# Patient Record
Sex: Female | Born: 1960 | Race: White | Hispanic: No | Marital: Married | Smoking: Never smoker
Health system: Southern US, Community
[De-identification: ages and names within clinical notes are randomized; demographics above are authoritative.]

## PROBLEM LIST (undated history)

## (undated) DIAGNOSIS — C55 Malignant neoplasm of uterus, part unspecified: Secondary | ICD-10-CM

## (undated) DIAGNOSIS — F419 Anxiety disorder, unspecified: Secondary | ICD-10-CM

## (undated) DIAGNOSIS — I10 Essential (primary) hypertension: Secondary | ICD-10-CM

## (undated) DIAGNOSIS — C801 Malignant (primary) neoplasm, unspecified: Secondary | ICD-10-CM

## (undated) HISTORY — PX: ABDOMINAL HYSTERECTOMY: SHX81

## (undated) HISTORY — PX: TONSILLECTOMY: SUR1361

---

## 2015-07-10 ENCOUNTER — Emergency Department (HOSPITAL_BASED_OUTPATIENT_CLINIC_OR_DEPARTMENT_OTHER)
Admission: EM | Admit: 2015-07-10 | Discharge: 2015-07-10 | Disposition: A | Discharge status: Home / Self Care | Payer: BLUE CROSS/BLUE SHIELD | Attending: Emergency Medicine | Admitting: Emergency Medicine

## 2015-07-10 ENCOUNTER — Encounter (HOSPITAL_BASED_OUTPATIENT_CLINIC_OR_DEPARTMENT_OTHER): Payer: Self-pay | Admitting: Emergency Medicine

## 2015-07-10 ENCOUNTER — Emergency Department (HOSPITAL_BASED_OUTPATIENT_CLINIC_OR_DEPARTMENT_OTHER): Payer: BLUE CROSS/BLUE SHIELD

## 2015-07-10 DIAGNOSIS — R0609 Other forms of dyspnea: Secondary | ICD-10-CM

## 2015-07-10 DIAGNOSIS — R0602 Shortness of breath: Secondary | ICD-10-CM | POA: Diagnosis present

## 2015-07-10 DIAGNOSIS — R05 Cough: Secondary | ICD-10-CM | POA: Insufficient documentation

## 2015-07-10 DIAGNOSIS — Z8541 Personal history of malignant neoplasm of cervix uteri: Secondary | ICD-10-CM | POA: Insufficient documentation

## 2015-07-10 DIAGNOSIS — Z79899 Other long term (current) drug therapy: Secondary | ICD-10-CM | POA: Insufficient documentation

## 2015-07-10 DIAGNOSIS — I10 Essential (primary) hypertension: Secondary | ICD-10-CM | POA: Diagnosis not present

## 2015-07-10 DIAGNOSIS — F419 Anxiety disorder, unspecified: Secondary | ICD-10-CM | POA: Diagnosis not present

## 2015-07-10 DIAGNOSIS — J45901 Unspecified asthma with (acute) exacerbation: Secondary | ICD-10-CM | POA: Insufficient documentation

## 2015-07-10 HISTORY — DX: Anxiety disorder, unspecified: F41.9

## 2015-07-10 HISTORY — DX: Malignant neoplasm of uterus, part unspecified: C55

## 2015-07-10 HISTORY — DX: Essential (primary) hypertension: I10

## 2015-07-10 HISTORY — DX: Malignant (primary) neoplasm, unspecified: C80.1

## 2015-07-10 LAB — CBC WITH DIFFERENTIAL/PLATELET
Basophils Absolute: 0 10*3/uL (ref 0.0–0.1)
Basophils Relative: 0 %
EOS ABS: 0.3 10*3/uL (ref 0.0–0.7)
EOS PCT: 4 %
HCT: 38 % (ref 36.0–46.0)
HEMOGLOBIN: 13.5 g/dL (ref 12.0–15.0)
Lymphocytes Relative: 26 %
Lymphs Abs: 2.1 10*3/uL (ref 0.7–4.0)
MCH: 30.5 pg (ref 26.0–34.0)
MCHC: 35.5 g/dL (ref 30.0–36.0)
MCV: 86 fL (ref 78.0–100.0)
MONO ABS: 0.4 10*3/uL (ref 0.1–1.0)
MONOS PCT: 6 %
Neutro Abs: 5.1 10*3/uL (ref 1.7–7.7)
Neutrophils Relative %: 64 %
Platelets: 228 10*3/uL (ref 150–400)
RBC: 4.42 MIL/uL (ref 3.87–5.11)
RDW: 13.4 % (ref 11.5–15.5)
WBC: 7.8 10*3/uL (ref 4.0–10.5)

## 2015-07-10 LAB — BRAIN NATRIURETIC PEPTIDE: B Natriuretic Peptide: 11.2 pg/mL (ref 0.0–100.0)

## 2015-07-10 LAB — BASIC METABOLIC PANEL
Anion gap: 10 (ref 5–15)
BUN: 9 mg/dL (ref 6–20)
CALCIUM: 8.9 mg/dL (ref 8.9–10.3)
CO2: 20 mmol/L — AB (ref 22–32)
CREATININE: 0.66 mg/dL (ref 0.44–1.00)
Chloride: 101 mmol/L (ref 101–111)
GFR calc Af Amer: >60 mL/min (ref >60)
GFR calc non Af Amer: >60 mL/min (ref >60)
GLUCOSE: 114 mg/dL — AB (ref 65–99)
Potassium: 4.1 mmol/L (ref 3.5–5.1)
Sodium: 131 mmol/L — ABNORMAL LOW (ref 135–145)

## 2015-07-10 LAB — TROPONIN I

## 2015-07-10 MED ORDER — IOPAMIDOL (ISOVUE-370) INJECTION 76%
80.0000 mL | Freq: Once | INTRAVENOUS | Status: AC | PRN
  Administered 2015-07-10: 80 mL via INTRAVENOUS

## 2015-07-10 MED ORDER — SODIUM CHLORIDE 0.9 % IV BOLUS (SEPSIS)
1000.0000 mL | Freq: Once | INTRAVENOUS | Status: AC
  Administered 2015-07-10: 1000 mL via INTRAVENOUS

## 2015-07-10 MED ORDER — ALBUTEROL SULFATE HFA 108 (90 BASE) MCG/ACT IN AERS
2.0000 | INHALATION_SPRAY | Freq: Once | RESPIRATORY_TRACT | Status: AC
  Administered 2015-07-10: 2 via RESPIRATORY_TRACT
  Filled 2015-07-10: qty 6.7

## 2015-07-10 NOTE — Discharge Instructions (Signed)
Use the albuterol inhaler 1-2 puffs every 4 hours as needed for shortness of breath. If you develop worse shortness of breath, chest pressure/pain or other new/concerning symptoms then return to the ER

## 2015-07-10 NOTE — ED Notes (Signed)
Patient states that she has had SOb x 3 days. Went to urgent care and was sent here to r/o a PE - the patient reports that she was told that the X-ray was abnormal for decreased lung volume and the Dr was concerned. The patient in not distress unless talking. Can talk in complete sentances but this Rn can tell that she is SOB when talking. Patient states that she feels better when she is laying down.

## 2015-07-10 NOTE — ED Notes (Signed)
Pt and husband given d/c instructions as per chart. Verbalizes understanding. No questions. 

## 2015-07-10 NOTE — ED Provider Notes (Signed)
CSN: [id number]     Arrival date & time 07/10/15  1251 History   First MD Initiated Contact with Patient 07/10/15 1329     Chief Complaint  Patient presents with  . Shortness of Breath     (Consider location/radiation/quality/duration/timing/severity/associated sxs/prior Treatment) HPI  55 year old female presents with a three-day history of shortness of breath. Patient feels like she can't get enough air. When she is lying perfectly still and flat she feels normal. However talking or minimal exertion causes her to get short of breath. There is no chest pain. She coughs on deep inspiration but otherwise no cough. Her temperature is 100 here but she has not noticed a fever or had symptoms of fever or chills at home. Prior history of uterine cancer. She went to urgent care with a took an x-ray that showed "decreased lung volume and "the doctor was concerned about PE and so sent her here for a CT scan. No leg swelling, leg pain, recent travel, recent surgery, estrogen use, or hemoptysis. She notes a past history of asthma but does not have flares often.  Past Medical History  Diagnosis Date  . Cancer ([place])   . Uterine cancer ([place])   . Hypertension   . Anxiety    Past Surgical History  Procedure Laterality Date  . Abdominal hysterectomy    . Tonsillectomy     History reviewed. No pertinent family history. Social History  Substance Use Topics  . Smoking status: Never Smoker   . Smokeless tobacco: None  . Alcohol Use: Yes     Comment: rarely   OB History    No data available     Review of Systems  Constitutional: Negative for fever.  Respiratory: Positive for cough and shortness of breath.   Cardiovascular: Negative for chest pain and leg swelling.  All other systems reviewed and are negative.     Allergies  Review of patient's allergies indicates no known allergies.  Home Medications   Prior to Admission medications   Medication Sig Start Date End Date Taking?  Authorizing Provider  citalopram (CELEXA) 20 MG tablet Take 20 mg by mouth daily.   Yes Historical Provider, MD  losartan (COZAAR) 25 MG tablet Take 25 mg by mouth daily.   Yes Historical Provider, MD   BP 90/66 mmHg  Pulse 96  Temp(Src) 100 F (37.8 C) (Oral)  Resp 20  Ht 5' 4.5" (1.638 m)  Wt 165 lb 5.5 oz (75 kg)  BMI 27.95 kg/m2  SpO2 100% Physical Exam  Constitutional: She is oriented to person, place, and time. She appears well-developed and well-nourished. No distress.  HENT:  Head: Normocephalic and atraumatic.  Right Ear: External ear normal.  Left Ear: External ear normal.  Nose: Nose normal.  Eyes: Right eye exhibits no discharge. Left eye exhibits no discharge.  Cardiovascular: Normal rate, regular rhythm and normal heart sounds.   Pulmonary/Chest: Effort normal and breath sounds normal. She has no wheezes.  Abdominal: Soft. She exhibits no distension. There is no tenderness.  Musculoskeletal: She exhibits no edema.  Neurological: She is alert and oriented to person, place, and time.  Skin: Skin is warm and dry. She is not diaphoretic.  Nursing note and vitals reviewed.   ED Course  Procedures (including critical care time) Labs Review Labs Reviewed  BASIC METABOLIC PANEL - Abnormal; Notable for the following:    Sodium 131 (*)    CO2 20 (*)    Glucose, Bld 114 (*)    All  other components within normal limits  CBC WITH DIFFERENTIAL/PLATELET  TROPONIN I  BRAIN NATRIURETIC PEPTIDE    Imaging Review Ct Angio Chest Pe W/cm &/or Wo Cm  07/10/2015  CLINICAL DATA:  Shortness of breath for 3 days. Uterine carcinoma. Clinical suspicion for pulmonary embolism. EXAM: CT ANGIOGRAPHY CHEST WITH CONTRAST TECHNIQUE: Multidetector CT imaging of the chest was performed using the standard protocol during bolus administration of intravenous contrast. Multiplanar CT image reconstructions and MIPs were obtained to evaluate the vascular anatomy. CONTRAST:  80 mL Isovue 370  COMPARISON:  None. FINDINGS: Mediastinum/Lymph Nodes: No pulmonary emboli or thoracic aortic dissection identified. Heart size is within normal limits. No evidence of pericardial effusion. No masses or pathologically enlarged lymph nodes identified. Lungs/Pleura: No pulmonary mass, infiltrate, or effusion. Upper abdomen: No acute findings. Musculoskeletal: No chest wall mass or suspicious bone lesions identified. Review of the MIP images confirms the above findings. IMPRESSION: Negative. No evidence of pulmonary embolism or other active disease within the thorax. Electronically Signed   By: Earle Gell M.D.   On: 07/10/2015 15:00   I have personally reviewed and evaluated these images and lab results as part of my medical decision-making.   EKG Interpretation   Date/Time:  Saturday July 10 2015 13:25:11 EDT Ventricular Rate:  76 PR Interval:  167 QRS Duration: 110 QT Interval:  392 QTC Calculation: 441 R Axis:   -90 Text Interpretation:  Sinus rhythm Left anterior fascicular block Abnormal  R-wave progression, late transition Borderline T abnormalities, anterior  leads No old tracing to compare Confirmed by Ion Gonnella  MD, [place] ([id number]) on  07/10/2015 1:28:55 PM       EKG Interpretation  Date/Time:  Saturday July 10 2015 15:09:27 EDT Ventricular Rate:  75 PR Interval:  170 QRS Duration: 112 QT Interval:  414 QTC Calculation: 462 R Axis:   -86 Text Interpretation:  Sinus rhythm Left anterior fascicular block Abnormal R-wave progression, late transition Nonspecific T abnormalities, anterior leads no significant change since earlier in the day Confirmed by [place] ([id number]) on 07/10/2015 3:16:38 PM       MDM   Final diagnoses:  Dyspnea on exertion    Patient's workup shows no obvious cause for dyspnea. CT scan is obtained given a [place] of 4.5 (PE equal/#1 and tachycardia at urgent care). No obvious PE or pneumonia. During her workup she was given albuterol by inhaler. After  workup was completed she was ambulated around the ED and had no hypoxia and did not feel any shortness of breath. She feels like she is back to normal after the inhaler. Could've had some bronchospasm or mild bronchitis causing her symptoms. While her ECG does show some nonspecific T waves I think ACS is less likely. There is no old for me to compare to but there are no evolving changes. With 3 days of nearly continuous symptoms and a negative troponin I do not think further workup is indicated emergently. Her friend is a cardiology PA in this area and she feels like she can get seen in the clinic in the next couple days where they can reevaluate her and possibly do a stress test. Discussed strict return precautions and recommended using albuterol inhaler at home as needed.    Sherwood Gambler, MD 07/10/15 [phone]

## 2017-03-23 IMAGING — CT CT ANGIO CHEST
2 of 6 series · 19 of 36 positions shown · IV contrast (isovue)
Comparison: None.

CLINICAL DATA: Shortness of breath for 3 days. Uterine carcinoma.
Clinical suspicion for pulmonary embolism.

EXAM:
CT ANGIOGRAPHY CHEST WITH CONTRAST
TECHNIQUE: Multidetector CT imaging of the chest was performed using the
standard protocol during bolus administration of intravenous
contrast. Multiplanar CT image reconstructions and MIPs were
obtained to evaluate the vascular anatomy.
CONTRAST:  80 mL Isovue 370

[Series 8: pe thins · axial · 0.59mm/px · z∈[-295,-40]mm · 18 of 285 slices shown]
[im 15/285  lung]
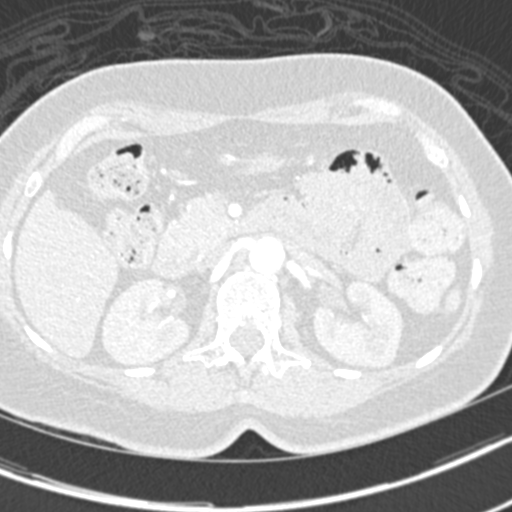
[im 29/285  mediastinal]
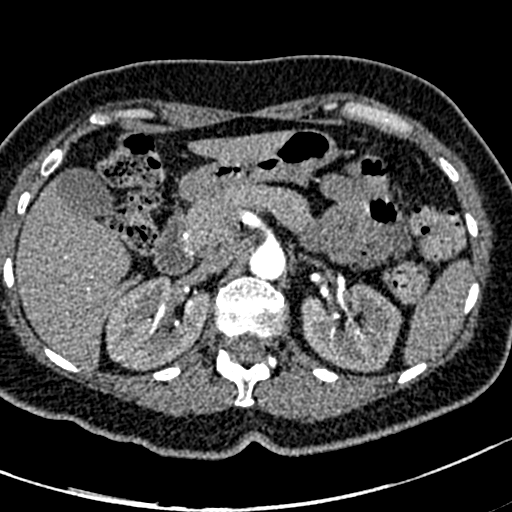
[im 43/285  lung]
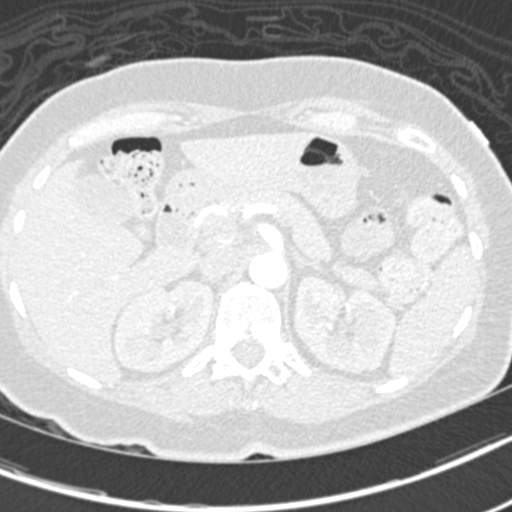
[im 57/285  mediastinal]
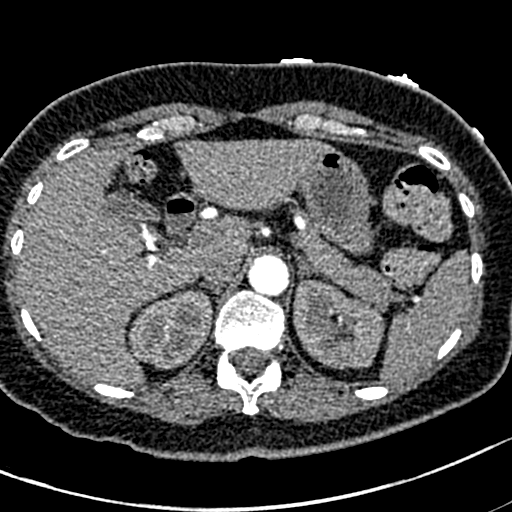
[im 72/285  lung]
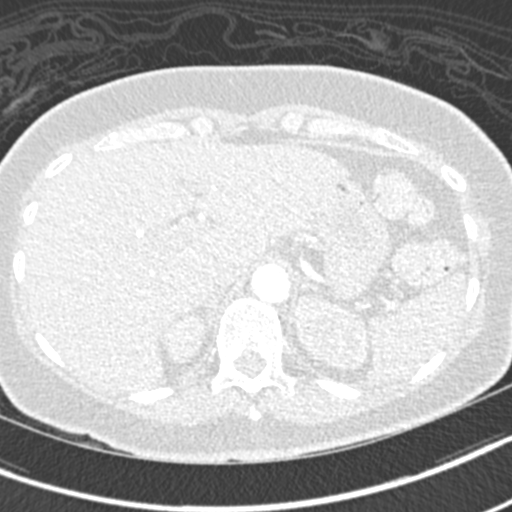
[im 86/285  mediastinal]
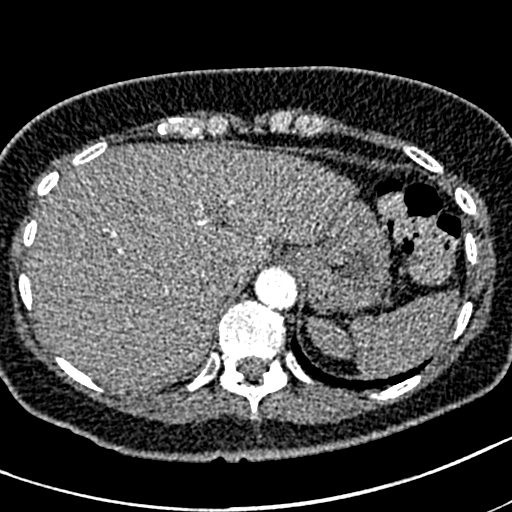
[im 100/285  lung]
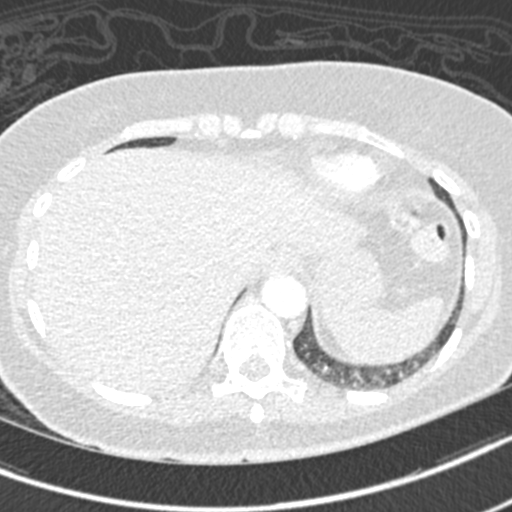
[im 114/285  mediastinal]
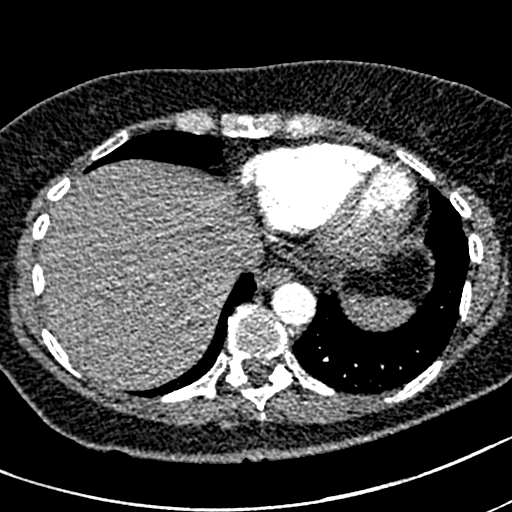
[im 128/285  lung]
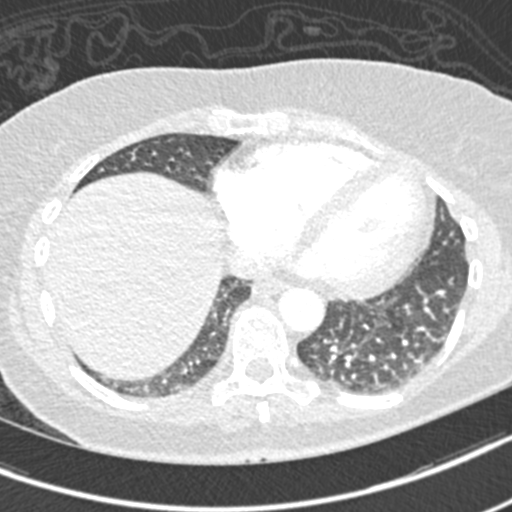
[im 157/285  mediastinal]
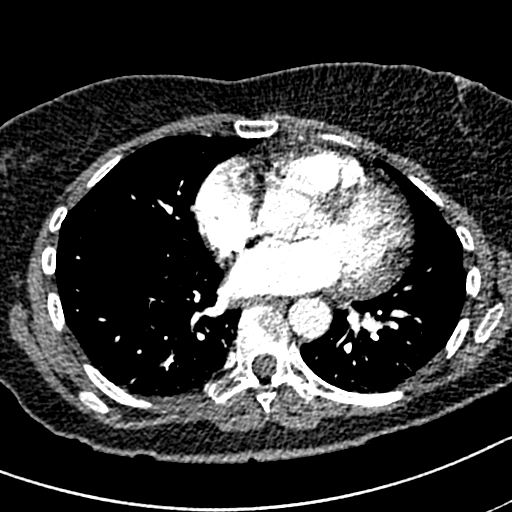
[im 171/285  lung]
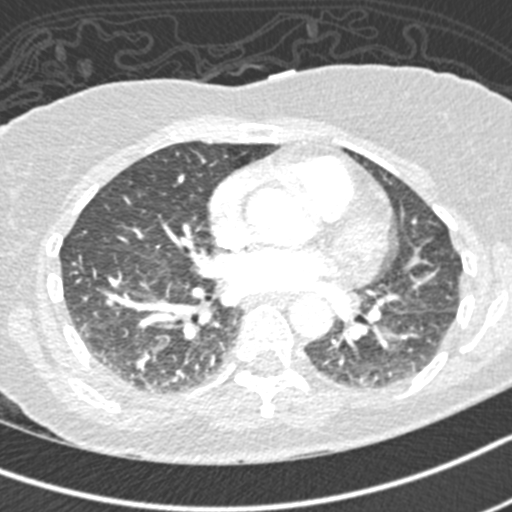
[im 185/285  mediastinal]
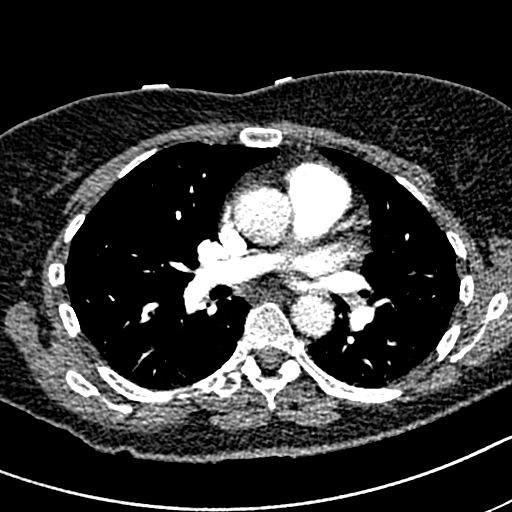
[im 199/285  lung]
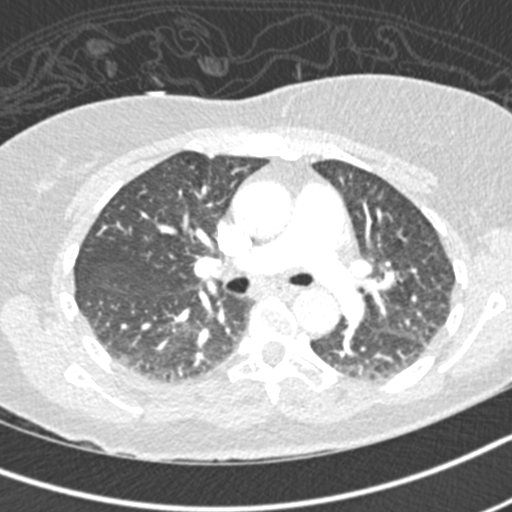
[im 214/285  mediastinal]
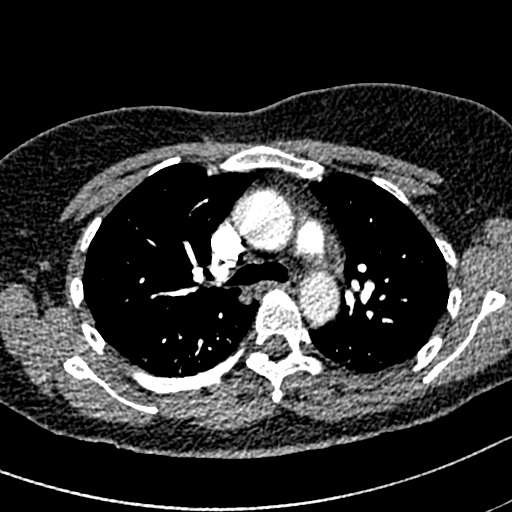
[im 228/285  lung]
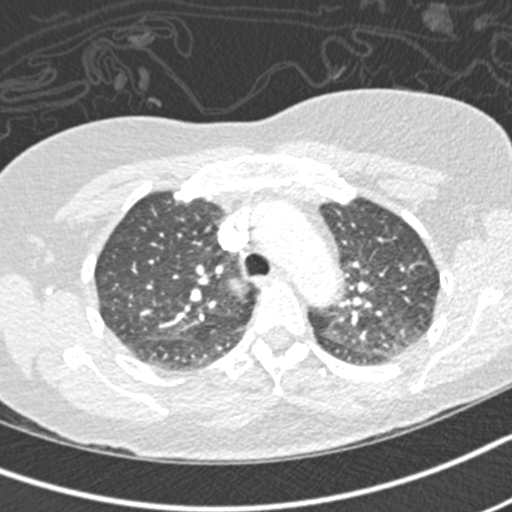
[im 242/285  mediastinal]
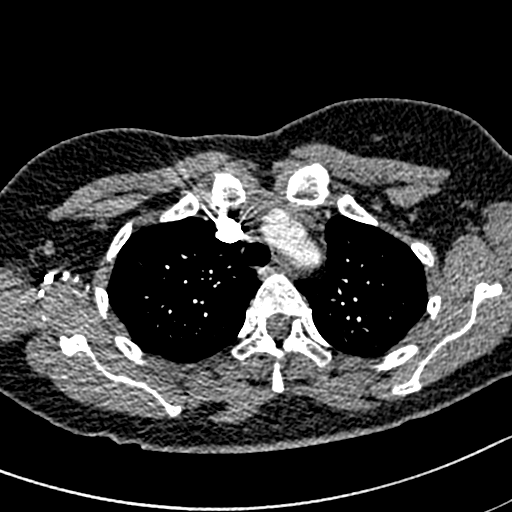
[im 256/285  lung]
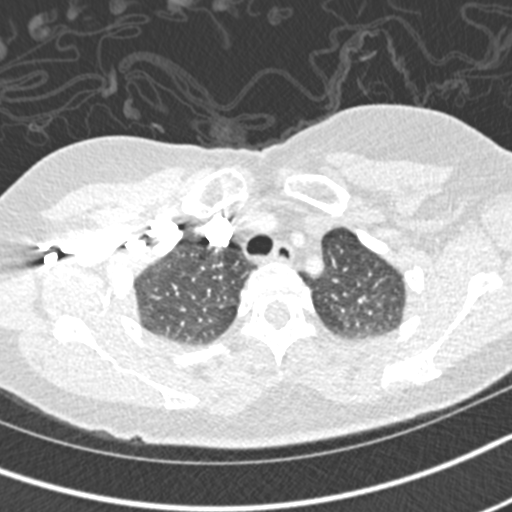
[im 270/285  mediastinal]
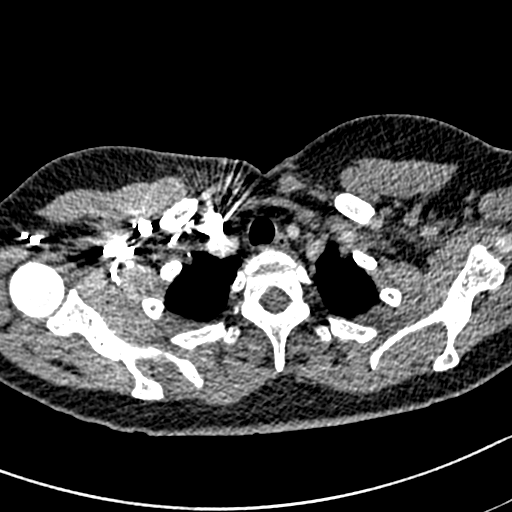

[Series 9: pe coronal mpr · coronal · 0.59mm/px · 1 of 88 slices shown]
[im 44/88  mediastinal]
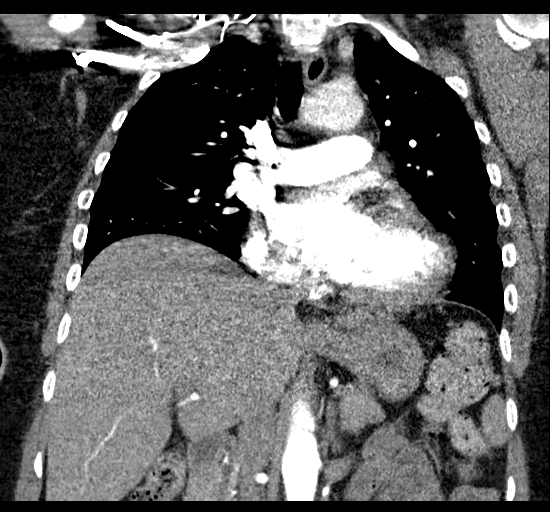

[19 of 36 positions shown; findings below may reference images not displayed]

FINDINGS: Mediastinum/Lymph Nodes: No pulmonary emboli or thoracic aortic
dissection identified. Heart size is within normal limits. No
evidence of pericardial effusion. No masses or pathologically
enlarged lymph nodes identified.

Lungs/Pleura: No pulmonary mass, infiltrate, or effusion.

Upper abdomen: No acute findings.

Musculoskeletal: No chest wall mass or suspicious bone lesions
identified.

Review of the MIP images confirms the above findings.
IMPRESSION: Negative. No evidence of pulmonary embolism or other active disease
within the thorax.

## 2022-11-10 ENCOUNTER — Telehealth: Payer: Self-pay | Admitting: Gastroenterology

## 2022-11-10 NOTE — Telephone Encounter (Signed)
Patient will have records faxed from previous provider for Dr Russella Dar to review.

## 2022-11-21 NOTE — Telephone Encounter (Signed)
Faxed requested for patient records to [facility].

## 2022-11-28 ENCOUNTER — Telehealth: Payer: Self-pay | Admitting: Gastroenterology

## 2022-11-28 NOTE — Telephone Encounter (Signed)
Called patient to advise we have records and also to ask the reason for request to Dr. Russella Dar. Left voicemail.

## 2022-11-28 NOTE — Telephone Encounter (Signed)
Given my retirement in December 2024 I am no longer accepting new patients. Perhaps one of our other physicians can accommodate.

## 2022-11-28 NOTE — Telephone Encounter (Signed)
Hi Dr. Russella Dar,  Patient called requesting a transfer of care specifically over to you if possible from [facility]. Stated her previous provider is no longer available and her husband is currently your patient. Seeking to have her colonoscopy done as soon as possible. Records were obtained and scanned into [employer] for you to review and advise on scheduling.   Thanks

## 2022-12-06 NOTE — Telephone Encounter (Signed)
Looks like her next colonoscopy is due in 01/2024 for colon cancer screening. Okay to place a recall for a colonoscopy in the [facility] in 01/2024. If she has any GI concerns, okay to schedule for OV.  Colonoscopy 01/29/14: Normal. Prep was adequate to identify polyps 5 mm or larger. Repeat colonoscopy recommended in 10 years.

## 2022-12-06 NOTE — Telephone Encounter (Signed)
Hi Dr. Leonides Schanz,  Supervising Provider 12/06/22-PM  Would be able to accept this transfer of care request?  Thanks

## 2022-12-07 NOTE — Telephone Encounter (Signed)
Patient has been advised and recall was placed in Epic.

## 2023-06-05 ENCOUNTER — Ambulatory Visit
Admission: EM | Admit: 2023-06-05 | Discharge: 2023-06-05 | Disposition: A | Discharge status: Home / Self Care | Attending: Family Medicine | Admitting: Family Medicine

## 2023-06-05 DIAGNOSIS — J309 Allergic rhinitis, unspecified: Secondary | ICD-10-CM

## 2023-06-05 DIAGNOSIS — J0100 Acute maxillary sinusitis, unspecified: Secondary | ICD-10-CM

## 2023-06-05 DIAGNOSIS — R059 Cough, unspecified: Secondary | ICD-10-CM | POA: Diagnosis not present

## 2023-06-05 DIAGNOSIS — J3489 Other specified disorders of nose and nasal sinuses: Secondary | ICD-10-CM | POA: Diagnosis not present

## 2023-06-05 MED ORDER — HYDROCODONE BIT-HOMATROP MBR 5-1.5 MG/5ML PO SOLN: 5.0000 mL | Freq: Four times a day (QID) | ORAL | 0 refills | Status: AC | PRN

## 2023-06-05 MED ORDER — FEXOFENADINE HCL 180 MG PO TABS: 180.0000 mg | ORAL_TABLET | Freq: Every day | ORAL | 0 refills | Status: AC

## 2023-06-05 MED ORDER — PREDNISONE 20 MG PO TABS: ORAL_TABLET | ORAL | 0 refills | Status: AC

## 2023-06-05 MED ORDER — AMOXICILLIN-POT CLAVULANATE 875-125 MG PO TABS: 1.0000 | ORAL_TABLET | Freq: Two times a day (BID) | ORAL | 0 refills | Status: AC

## 2023-06-05 NOTE — ED Triage Notes (Addendum)
 Pt presents to uc with co sore throat and congestion, sore throat, and chills. Pt reports recent travel to [place] and has had symptoms since. Pt reports about 2 weeks of symptoms. Covis neg at home

## 2023-06-05 NOTE — ED Provider Notes (Signed)
 [facility]    CSN: [id number] Arrival date & time: 06/05/23  1654      History   Chief Complaint Chief Complaint  Patient presents with   Sore Throat   facial congestion     HPI Betty Lee is a 63 y.o. female.   HPI 63 year old female presents with sore throat and facial congestion for 2 weeks.  PMH significant for uterine cancer and HTN.  Past Medical History:  Diagnosis Date   Anxiety    Cancer ([facility])    Hypertension    Uterine cancer ([facility])     There are no active problems to display for this patient.   Past Surgical History:  Procedure Laterality Date   ABDOMINAL HYSTERECTOMY     TONSILLECTOMY      OB History   No obstetric history on file.      Home Medications    Prior to Admission medications   Medication Sig Start Date End Date Taking? Authorizing Provider  amoxicillin-clavulanate (AUGMENTIN) 875-125 MG tablet Take 1 tablet by mouth every 12 (twelve) hours. 06/05/23  Yes Trevor Iha, FNP  fexofenadine [facility] ALLERGY) 180 MG tablet Take 1 tablet (180 mg total) by mouth daily for 15 days. 06/05/23 06/20/23 Yes Trevor Iha, FNP  HYDROcodone bit-homatropine (HYCODAN) 5-1.5 MG/5ML syrup Take 5 mLs by mouth every 6 (six) hours as needed for cough. 06/05/23  Yes Trevor Iha, FNP  predniSONE (DELTASONE) 20 MG tablet Take 3 tabs PO daily x 5 days. 06/05/23  Yes Trevor Iha, FNP  citalopram (CELEXA) 20 MG tablet Take 20 mg by mouth daily.    [provider]  losartan (COZAAR) 25 MG tablet Take 25 mg by mouth daily.    [provider]    Family History Family History  Problem Relation Age of Onset   Cancer Mother    Hypertension Father    Heart failure Father     Social History Social History   Tobacco Use   Smoking status: Never  Substance Use Topics   Alcohol use: Yes    Comment: rarely   Drug use: No     Allergies   Patient has no known allergies.   Review of Systems Review of  Systems   Physical Exam Triage Vital Signs ED Triage Vitals  Encounter Vitals Group     BP 06/05/23 1730 (!) 149/91     Systolic BP Percentile --      Diastolic BP Percentile --      Pulse Rate 06/05/23 1730 88     Resp 06/05/23 1730 16     Temp 06/05/23 1730 98.7 F (37.1 C)     Temp src --      SpO2 06/05/23 1730 98 %     Weight --      Height --      Head Circumference --      Peak Flow --      Pain Score 06/05/23 1729 3     Pain Loc --      Pain Education --      Exclude from Growth Chart --    No data found.  Updated Vital Signs BP (!) 149/91   Pulse 88   Temp 98.7 F (37.1 C)   Resp 16   SpO2 98%    Physical Exam Vitals and nursing note reviewed.  Constitutional:      Appearance: Normal appearance. She is obese. She is ill-appearing.  HENT:     Head: Normocephalic and  atraumatic.     Right Ear: Tympanic membrane and external ear normal.     Left Ear: Tympanic membrane and external ear normal.     Ears:     Comments: Significant eustachian tube dysfunction noted bilaterally    Nose:     Right Sinus: Maxillary sinus tenderness present.     Left Sinus: Maxillary sinus tenderness present.     Comments: Turbinates are erythematous/edematous    Mouth/Throat:     Mouth: Mucous membranes are moist.     Pharynx: Oropharynx is clear.     Comments: Significant amount of clear drainage of posterior oropharynx noted Eyes:     Extraocular Movements: Extraocular movements intact.     Conjunctiva/sclera: Conjunctivae normal.     Pupils: Pupils are equal, round, and reactive to light.  Cardiovascular:     Rate and Rhythm: Normal rate and regular rhythm.     Pulses: Normal pulses.     Heart sounds: Normal heart sounds.  Pulmonary:     Effort: Pulmonary effort is normal.     Breath sounds: Normal breath sounds. No wheezing, rhonchi or rales.     Comments: Infrequent nonproductive cough on exam Musculoskeletal:        General: Normal range of motion.     Cervical  back: Normal range of motion and neck supple.  Skin:    General: Skin is warm and dry.  Neurological:     General: No focal deficit present.     Mental Status: She is alert and oriented to person, place, and time. Mental status is at baseline.  Psychiatric:        Mood and Affect: Mood normal.        Behavior: Behavior normal.      UC Treatments / Results  Labs (all labs ordered are listed, but only abnormal results are displayed) Labs Reviewed - No data to display  EKG   Radiology No results found.  Procedures Procedures (including critical care time)  Medications Ordered in UC Medications - No data to display  Initial Impression / Assessment and Plan / UC Course  I have reviewed the triage vital signs and the nursing notes.  Pertinent labs & imaging results that were available during my care of the patient were reviewed by me and considered in my medical decision making (see chart for details).     MDM: 1.  Acute maxillary sinusitis, recurrence not specified-Rx'd Augmentin 875/125 mg tablet: Take 1 tablet twice daily x 7 days; 2.  Sinus pressure-Rx'd prednisone 20 mg tablet: Take 3 tabs p.o. daily x 5 days; 3.  Cough, unspecified type-Rx'd Hycodan 5-1.5 mg / 5 mL syrup. Advised patient to take medication as directed with food to completion.  4.  Allergic rhinitis, unspecified seasonality, unspecified trigger-Rx'd Allegra 180 mg fexofenadine: Take 1 tablet daily x 5 days, then as needed advised patient to take prednisone and Allegra with first dose of Augmentin for the next 5 of 7 days.  Advised may use Allegra as needed afterwards for concurrent postnasal drainage/drip.  Advised may use Hycodan cough syrup at night prior to sleep for cough due to sedative effects.  Encouraged to increase daily water intake to 64 ounces per day while taking these medications.  Advised if symptoms worsen and/or unresolved please follow-up with your PCP or here for further evaluation. Final  Clinical Impressions(s) / UC Diagnoses   Final diagnoses:  Acute maxillary sinusitis, recurrence not specified  Sinus pressure  Allergic rhinitis, unspecified seasonality, unspecified  trigger  Cough, unspecified type     Discharge Instructions      Advised patient to take medication as directed with food to completion.  Advised patient to take prednisone and Allegra with first dose of Augmentin for the next 5 of 7 days.  Advised may use Allegra as needed afterwards for concurrent postnasal drainage/drip.  Advised may use Hycodan cough syrup at night prior to sleep for cough due to sedative effects.  Encouraged to increase daily water intake to 64 ounces per day while taking these medications.  Advised if symptoms worsen and/or unresolved please follow-up with your PCP or here for further evaluation.     ED Prescriptions     Medication Sig Dispense Auth. Provider   amoxicillin-clavulanate (AUGMENTIN) 875-125 MG tablet Take 1 tablet by mouth every 12 (twelve) hours. 14 tablet Trevor Iha, FNP   predniSONE (DELTASONE) 20 MG tablet Take 3 tabs PO daily x 5 days. 15 tablet Trevor Iha, FNP   fexofenadine [facility] ALLERGY) 180 MG tablet Take 1 tablet (180 mg total) by mouth daily for 15 days. 15 tablet Trevor Iha, FNP   HYDROcodone bit-homatropine (HYCODAN) 5-1.5 MG/5ML syrup Take 5 mLs by mouth every 6 (six) hours as needed for cough. 120 mL Trevor Iha, FNP      I have reviewed the PDMP during this encounter.   Trevor Iha, FNP 06/05/23 [phone]

## 2023-06-05 NOTE — Discharge Instructions (Addendum)
 Advised patient to take medication as directed with food to completion.  Advised patient to take prednisone and Allegra with first dose of Augmentin for the next 5 of 7 days.  Advised may use Allegra as needed afterwards for concurrent postnasal drainage/drip.  Advised may use Hycodan cough syrup at night prior to sleep for cough due to sedative effects.  Encouraged to increase daily water intake to 64 ounces per day while taking these medications.  Advised if symptoms worsen and/or unresolved please follow-up with your PCP or here for further evaluation.

## 2023-11-07 ENCOUNTER — Other Ambulatory Visit: Payer: Self-pay | Admitting: Family Medicine

## 2023-11-07 DIAGNOSIS — M79672 Pain in left foot: Secondary | ICD-10-CM

## 2023-11-07 NOTE — Progress Notes (Signed)
 severe heel pain. 29yrs of chronic plantar fasciitis type sx w/ acute worsening and now nearly constant

## 2023-11-08 ENCOUNTER — Ambulatory Visit
Admission: RE | Admit: 2023-11-08 | Discharge: 2023-11-08 | Disposition: A | Discharge status: Home / Self Care | Source: Ambulatory Visit | Attending: Family Medicine | Admitting: Family Medicine

## 2023-11-08 DIAGNOSIS — M79672 Pain in left foot: Secondary | ICD-10-CM
